# Patient Record
Sex: Male | Born: 1970 | Hispanic: No | Marital: Single | Smoking: Current every day smoker
Health system: Southern US, Community
[De-identification: ages and names within clinical notes are randomized; demographics above are authoritative.]

---

## 1998-11-30 ENCOUNTER — Emergency Department (HOSPITAL_COMMUNITY): Admission: EM | Admit: 1998-11-30 | Discharge: 1998-11-30 | Payer: Self-pay | Admitting: *Deleted

## 2002-10-20 ENCOUNTER — Emergency Department (HOSPITAL_COMMUNITY): Admission: EM | Admit: 2002-10-20 | Discharge: 2002-10-21 | Payer: Self-pay | Admitting: Emergency Medicine

## 2005-12-18 ENCOUNTER — Emergency Department (HOSPITAL_COMMUNITY): Admission: EM | Admit: 2005-12-18 | Discharge: 2005-12-18 | Payer: Self-pay | Admitting: Emergency Medicine

## 2012-03-30 ENCOUNTER — Emergency Department (HOSPITAL_BASED_OUTPATIENT_CLINIC_OR_DEPARTMENT_OTHER)
Admission: EM | Admit: 2012-03-30 | Discharge: 2012-03-30 | Disposition: A | Discharge status: Home / Self Care | Payer: Self-pay | Attending: Emergency Medicine | Admitting: Emergency Medicine

## 2012-03-30 ENCOUNTER — Encounter (HOSPITAL_BASED_OUTPATIENT_CLINIC_OR_DEPARTMENT_OTHER): Payer: Self-pay | Admitting: *Deleted

## 2012-03-30 ENCOUNTER — Emergency Department (HOSPITAL_BASED_OUTPATIENT_CLINIC_OR_DEPARTMENT_OTHER): Payer: Self-pay

## 2012-03-30 DIAGNOSIS — W2203XA Walked into furniture, initial encounter: Secondary | ICD-10-CM | POA: Insufficient documentation

## 2012-03-30 DIAGNOSIS — F17200 Nicotine dependence, unspecified, uncomplicated: Secondary | ICD-10-CM | POA: Insufficient documentation

## 2012-03-30 DIAGNOSIS — S6292XA Unspecified fracture of left wrist and hand, initial encounter for closed fracture: Secondary | ICD-10-CM

## 2012-03-30 DIAGNOSIS — S62309A Unspecified fracture of unspecified metacarpal bone, initial encounter for closed fracture: Secondary | ICD-10-CM | POA: Insufficient documentation

## 2012-03-30 MED ORDER — HYDROCODONE-ACETAMINOPHEN 5-500 MG PO TABS: 1.0000 | ORAL_TABLET | Freq: Four times a day (QID) | ORAL | Status: DC | PRN

## 2012-03-30 NOTE — ED Provider Notes (Signed)
I saw and evaluated the patient, reviewed the resident's note and I agree with the findings and plan.   .Face to face Exam:  General:  Awake HEENT:  Atraumatic Resp:  Normal effort Abd:  Nondistended Neuro:No focal weakness Lymph: No adenopathy   Nelia Shi, MD 03/30/12 1715
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
## 2013-09-30 ENCOUNTER — Emergency Department (HOSPITAL_COMMUNITY)
Admission: EM | Admit: 2013-09-30 | Discharge: 2013-09-30 | Disposition: A | Discharge status: Home / Self Care | Payer: No Typology Code available for payment source | Attending: Emergency Medicine | Admitting: Emergency Medicine

## 2013-09-30 ENCOUNTER — Encounter (HOSPITAL_COMMUNITY): Payer: Self-pay | Admitting: Emergency Medicine

## 2013-09-30 ENCOUNTER — Emergency Department (HOSPITAL_COMMUNITY): Payer: No Typology Code available for payment source

## 2013-09-30 DIAGNOSIS — S29019A Strain of muscle and tendon of unspecified wall of thorax, initial encounter: Secondary | ICD-10-CM

## 2013-09-30 DIAGNOSIS — S0990XA Unspecified injury of head, initial encounter: Secondary | ICD-10-CM | POA: Insufficient documentation

## 2013-09-30 DIAGNOSIS — S239XXA Sprain of unspecified parts of thorax, initial encounter: Secondary | ICD-10-CM | POA: Insufficient documentation

## 2013-09-30 DIAGNOSIS — S161XXA Strain of muscle, fascia and tendon at neck level, initial encounter: Secondary | ICD-10-CM

## 2013-09-30 DIAGNOSIS — Y9389 Activity, other specified: Secondary | ICD-10-CM | POA: Insufficient documentation

## 2013-09-30 DIAGNOSIS — R1011 Right upper quadrant pain: Secondary | ICD-10-CM | POA: Insufficient documentation

## 2013-09-30 DIAGNOSIS — Z880 Allergy status to penicillin: Secondary | ICD-10-CM | POA: Insufficient documentation

## 2013-09-30 DIAGNOSIS — IMO0002 Reserved for concepts with insufficient information to code with codable children: Secondary | ICD-10-CM | POA: Insufficient documentation

## 2013-09-30 DIAGNOSIS — S139XXA Sprain of joints and ligaments of unspecified parts of neck, initial encounter: Secondary | ICD-10-CM | POA: Insufficient documentation

## 2013-09-30 DIAGNOSIS — F17200 Nicotine dependence, unspecified, uncomplicated: Secondary | ICD-10-CM | POA: Insufficient documentation

## 2013-09-30 DIAGNOSIS — Y92410 Unspecified street and highway as the place of occurrence of the external cause: Secondary | ICD-10-CM | POA: Insufficient documentation

## 2013-09-30 LAB — I-STAT CHEM 8, ED
BUN: 13 mg/dL (ref 6–23)
CALCIUM ION: 1.13 mmol/L (ref 1.12–1.23)
CHLORIDE: 101 meq/L (ref 96–112)
Creatinine, Ser: 1.4 mg/dL — ABNORMAL HIGH (ref 0.50–1.35)
GLUCOSE: 91 mg/dL (ref 70–99)
HEMATOCRIT: 44 % (ref 39.0–52.0)
HEMOGLOBIN: 15 g/dL (ref 13.0–17.0)
Potassium: 4 mEq/L (ref 3.7–5.3)
Sodium: 142 mEq/L (ref 137–147)
TCO2: 28 mmol/L (ref 0–100)

## 2013-09-30 MED ORDER — ETODOLAC 500 MG PO TABS: 500.0000 mg | ORAL_TABLET | Freq: Two times a day (BID) | ORAL | Status: AC

## 2013-09-30 MED ORDER — MORPHINE SULFATE 4 MG/ML IJ SOLN
4.0000 mg | Freq: Once | INTRAMUSCULAR | Status: AC
  Administered 2013-09-30: 4 mg via INTRAVENOUS
  Filled 2013-09-30: qty 1

## 2013-09-30 MED ORDER — IOHEXOL 300 MG/ML  SOLN
100.0000 mL | Freq: Once | INTRAMUSCULAR | Status: AC | PRN
  Administered 2013-09-30: 100 mL via INTRAVENOUS

## 2013-09-30 MED ORDER — CYCLOBENZAPRINE HCL 5 MG PO TABS: 5.0000 mg | ORAL_TABLET | Freq: Three times a day (TID) | ORAL | Status: AC | PRN

## 2013-09-30 MED ORDER — TRAMADOL HCL 50 MG PO TABS: 50.0000 mg | ORAL_TABLET | Freq: Four times a day (QID) | ORAL | Status: DC | PRN
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
## 2013-10-04 NOTE — ED Provider Notes (Signed)
I agree with plan for acute assessment.  Dalton BoucheJoshua M Azzie RoupZavitz   Maxson Oddo M Dalton Neuser, MD 10/04/13 [phone]

## 2019-02-14 ENCOUNTER — Emergency Department (HOSPITAL_COMMUNITY): Payer: Self-pay

## 2019-02-14 ENCOUNTER — Other Ambulatory Visit: Payer: Self-pay

## 2019-02-14 ENCOUNTER — Emergency Department (HOSPITAL_COMMUNITY)
Admission: EM | Admit: 2019-02-14 | Discharge: 2019-02-14 | Disposition: A | Discharge status: Home / Self Care | Payer: Self-pay | Attending: Emergency Medicine | Admitting: Emergency Medicine

## 2019-02-14 ENCOUNTER — Encounter (HOSPITAL_COMMUNITY): Payer: Self-pay | Admitting: Emergency Medicine

## 2019-02-14 DIAGNOSIS — W109XXA Fall (on) (from) unspecified stairs and steps, initial encounter: Secondary | ICD-10-CM | POA: Insufficient documentation

## 2019-02-14 DIAGNOSIS — Y929 Unspecified place or not applicable: Secondary | ICD-10-CM | POA: Insufficient documentation

## 2019-02-14 DIAGNOSIS — R0781 Pleurodynia: Secondary | ICD-10-CM

## 2019-02-14 DIAGNOSIS — F17210 Nicotine dependence, cigarettes, uncomplicated: Secondary | ICD-10-CM | POA: Insufficient documentation

## 2019-02-14 DIAGNOSIS — S20212A Contusion of left front wall of thorax, initial encounter: Secondary | ICD-10-CM | POA: Insufficient documentation

## 2019-02-14 DIAGNOSIS — Y999 Unspecified external cause status: Secondary | ICD-10-CM | POA: Insufficient documentation

## 2019-02-14 DIAGNOSIS — Y939 Activity, unspecified: Secondary | ICD-10-CM | POA: Insufficient documentation

## 2019-02-14 MED ORDER — HYDROCODONE-ACETAMINOPHEN 5-325 MG PO TABS: 1.0000 | ORAL_TABLET | Freq: Four times a day (QID) | ORAL | 0 refills | Status: AC | PRN

## 2019-02-14 NOTE — ED Triage Notes (Signed)
Patient slipped and fell last Friday on wet floor , denies LOC/ambulatory , reports left lateral ribcage pain , respirations unlabored.

## 2019-02-14 NOTE — Discharge Instructions (Signed)
Use incentive spirometer as instructed. Reserve vicodin for night time use or when ibuprofen and/or tylenol is not effective.

## 2019-02-14 NOTE — ED Provider Notes (Signed)
[facility]   CSN: [id number] Arrival date & time: 02/14/19  1851     History   Chief Complaint Chief Complaint  Patient presents with  . Rib Injury    HPI Lenard ForthBharat Hoelting is a 48 y.o. male.     Patient states he slipped on a wet step and fell down 4 steps, primarily on the posterior aspect of his left chest. He is reporting pain to the posterior chest wall, with increased pain with movement and deep inspiration.  The history is provided by the patient. No language interpreter was used.  Fall This is a new problem. The current episode started yesterday. The problem occurs constantly. The problem has not changed since onset.Pertinent negatives include no abdominal pain and no shortness of breath.    History reviewed. No pertinent past medical history.  There are no active problems to display for this patient.   History reviewed. No pertinent surgical history.      Home Medications    Prior to Admission medications   Medication Sig Start Date End Date Taking? Authorizing Provider  cyclobenzaprine (FLEXERIL) 5 MG tablet Take 1 tablet (5 mg total) by mouth 3 (three) times daily as needed for muscle spasms. 09/30/13   Linwood DibblesKnapp, Jon, MD  etodolac (LODINE) 500 MG tablet Take 1 tablet (500 mg total) by mouth 2 (two) times daily. 09/30/13   Linwood DibblesKnapp, Jon, MD    Family History Family History  Problem Relation Age of Onset  . Diabetes Father   . Hypertension Father     Social History Social History   Tobacco Use  . Smoking status: Current Every Day Smoker    Types: Cigarettes  Substance Use Topics  . Alcohol use: Yes  . Drug use: Yes    Types: Marijuana     Allergies   Ibuprofen, Penicillins, and Trimox [amoxicillin]   Review of Systems Review of Systems  Respiratory: Negative for shortness of breath.   Gastrointestinal: Negative for abdominal pain.  Musculoskeletal: Negative for back pain.  All other systems  reviewed and are negative.    Physical Exam Updated Vital Signs BP 134/79   Pulse (!) 52   Temp 98.4 F (36.9 C) (Oral)   Resp 16   SpO2 100%   Physical Exam Vitals signs and nursing note reviewed.  Constitutional:      Appearance: Normal appearance.  HENT:     Head: Atraumatic.  Eyes:     Conjunctiva/sclera: Conjunctivae normal.  Neck:     Musculoskeletal: Normal range of motion and neck supple.  Cardiovascular:     Rate and Rhythm: Normal rate and regular rhythm.     Pulses: Normal pulses.  Pulmonary:     Effort: Pulmonary effort is normal.     Breath sounds: Normal breath sounds.  Chest:     Chest wall: Tenderness present. No crepitus.     Comments: Patient with tenderness to left posterior lateral chest wall. No crepitus. Abdominal:     Palpations: Abdomen is soft.  Musculoskeletal: Normal range of motion.  Skin:    General: Skin is warm and dry.  Neurological:     Mental Status: He is alert and oriented to person, place, and time.  Psychiatric:        Mood and Affect: Mood normal.      ED Treatments / Results  Labs (all labs ordered are listed, but only abnormal results are displayed) Labs Reviewed - No data to display  EKG  None  Radiology Dg Ribs Unilateral W/chest Left  Result Date: 02/14/2019 CLINICAL DATA:  Left rib pain after fall last week. EXAM: LEFT RIBS AND CHEST - 3+ VIEW COMPARISON:  Radiographs of September 30, 2013. FINDINGS: No fracture or other bone lesions are seen involving the ribs. There is no evidence of pneumothorax or pleural effusion. Both lungs are clear. Heart size and mediastinal contours are within normal limits. IMPRESSION: Negative. Electronically Signed   By: Marijo Conception M.D.   On: 02/14/2019 19:50    Procedures Procedures (including critical care time)  Medications Ordered in ED Medications - No data to display   Initial Impression / Assessment and Plan / ED Course  I have reviewed the triage vital signs and the  nursing notes.  Pertinent labs & imaging results that were available during my care of the patient were reviewed by me and considered in my medical decision making (see chart for details).        Patient with left posterior chest wall pain s/p fall. No indication of rib fractures on xray. Care instructions provided for chest wall pain and contusion, along with provision of incentive spirometer for use at home. Return precautions discussed. Patient appears safe for discharge.  Final Clinical Impressions(s) / ED Diagnoses   Final diagnoses:  Rib pain on right side  Contusion of rib on left side, initial encounter    ED Discharge Orders         Ordered    HYDROcodone-acetaminophen (NORCO/VICODIN) 5-325 MG tablet  Every 6 hours PRN     02/14/19 2049           Etta Quill, NP 02/15/19 1700    Carmin Muskrat, MD 02/18/19 2219

## 2019-02-14 NOTE — ED Notes (Signed)
Pt has edema on left rib area. Pt states it is painful to inhale, to move his left arm and to lay on left side.

## 2020-10-11 IMAGING — CR LEFT RIBS AND CHEST - 3+ VIEW
5 series · 5 of 5 positions shown · non-contrast
Comparison: Radiographs September 30, 2013.

CLINICAL DATA: Left rib pain after fall last week.

EXAM:
LEFT RIBS AND CHEST - 3+ VIEW

[chest pa]
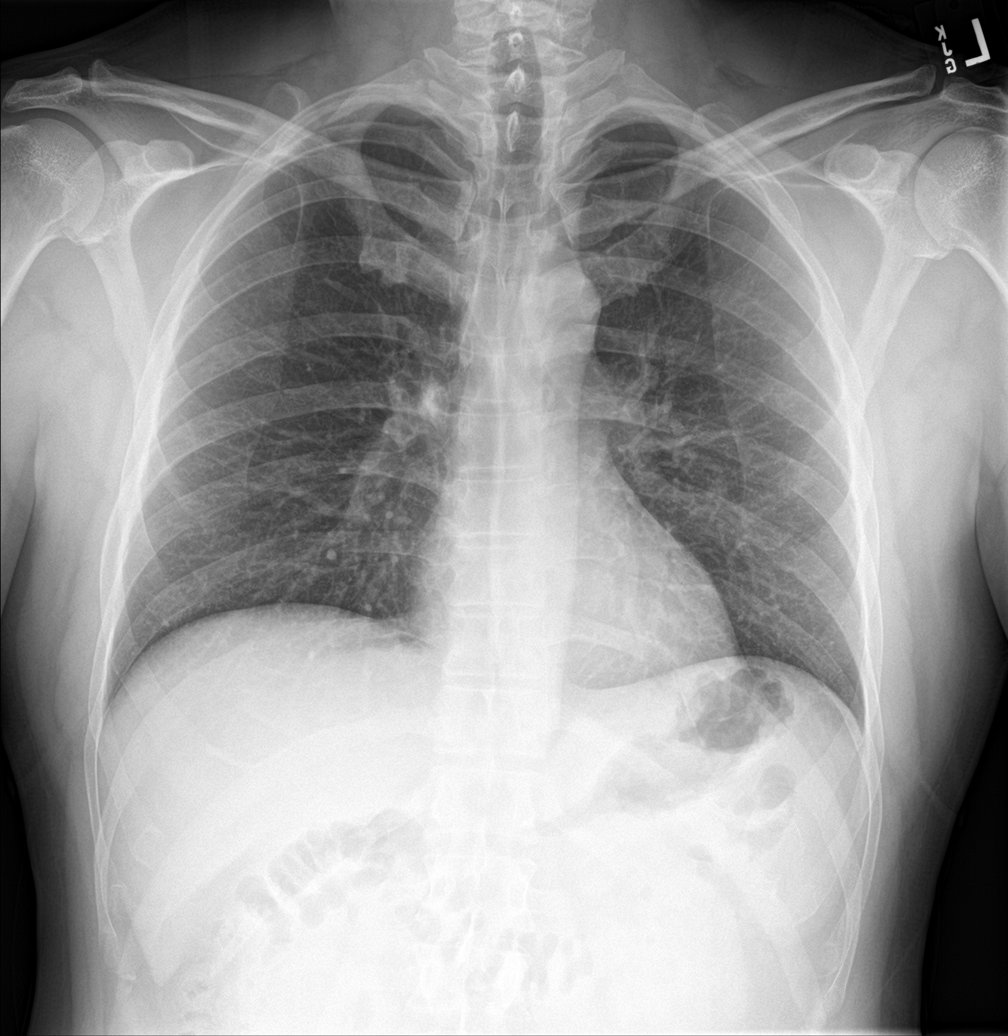

[rib pa obl (1 of 2)]
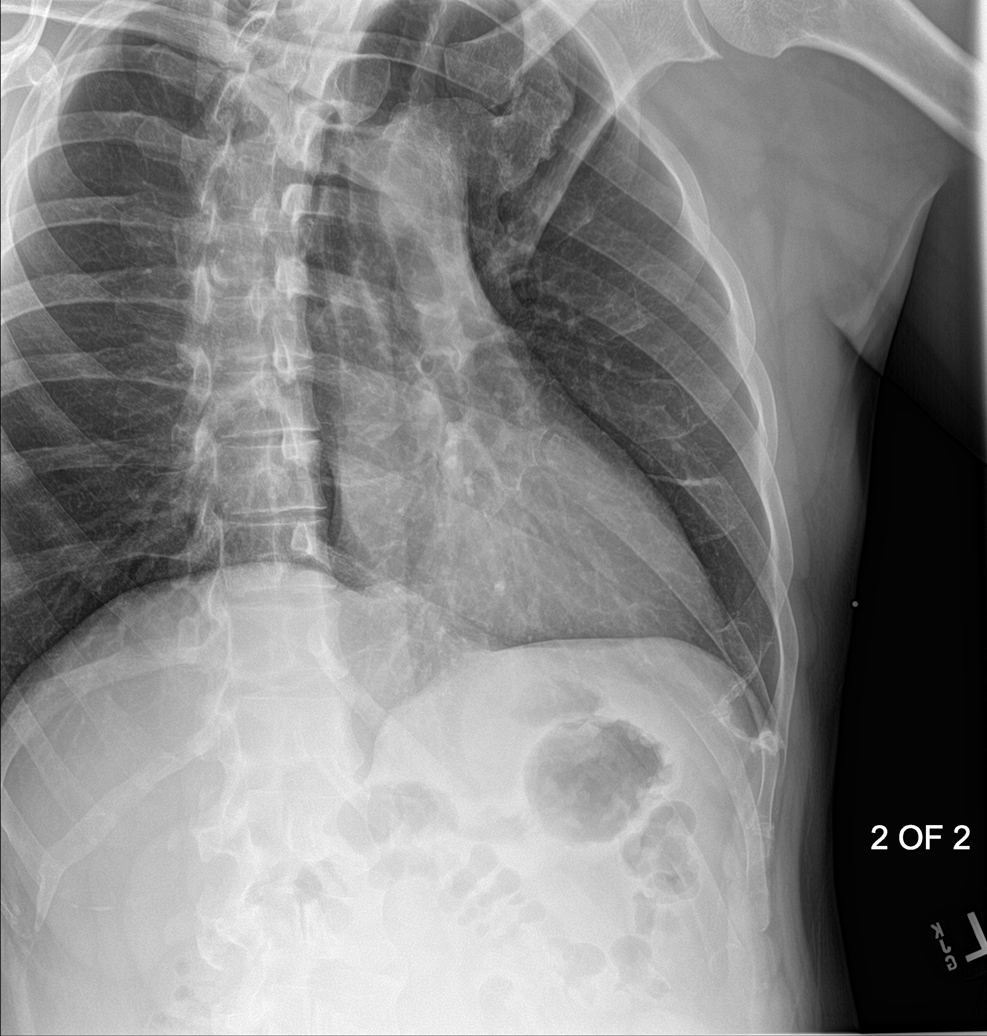

[rib pa (1 of 2)]
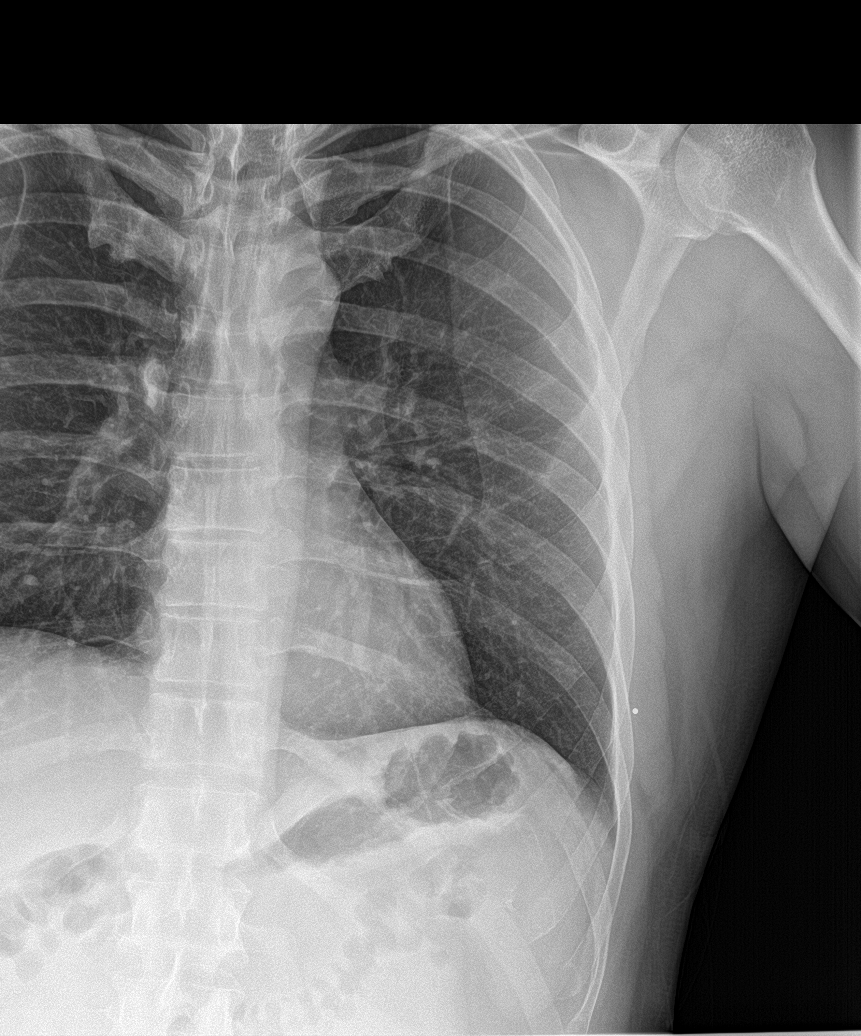

[rib pa (2 of 2)]
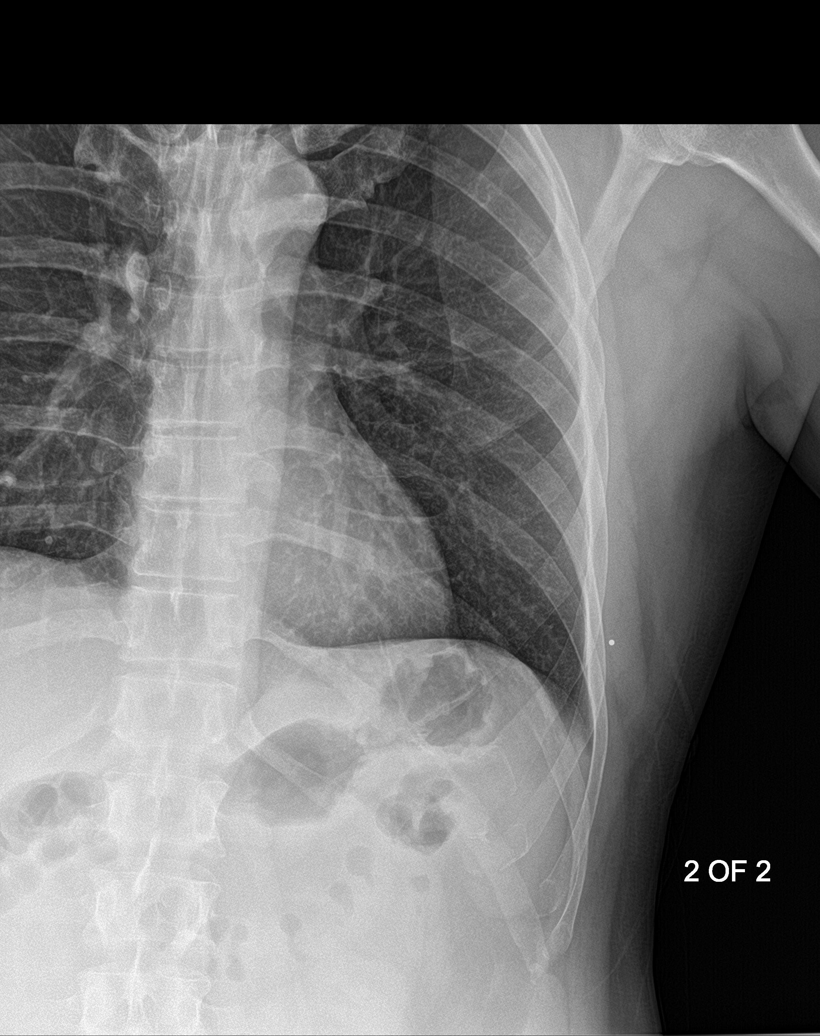

[rib pa obl (2 of 2)]
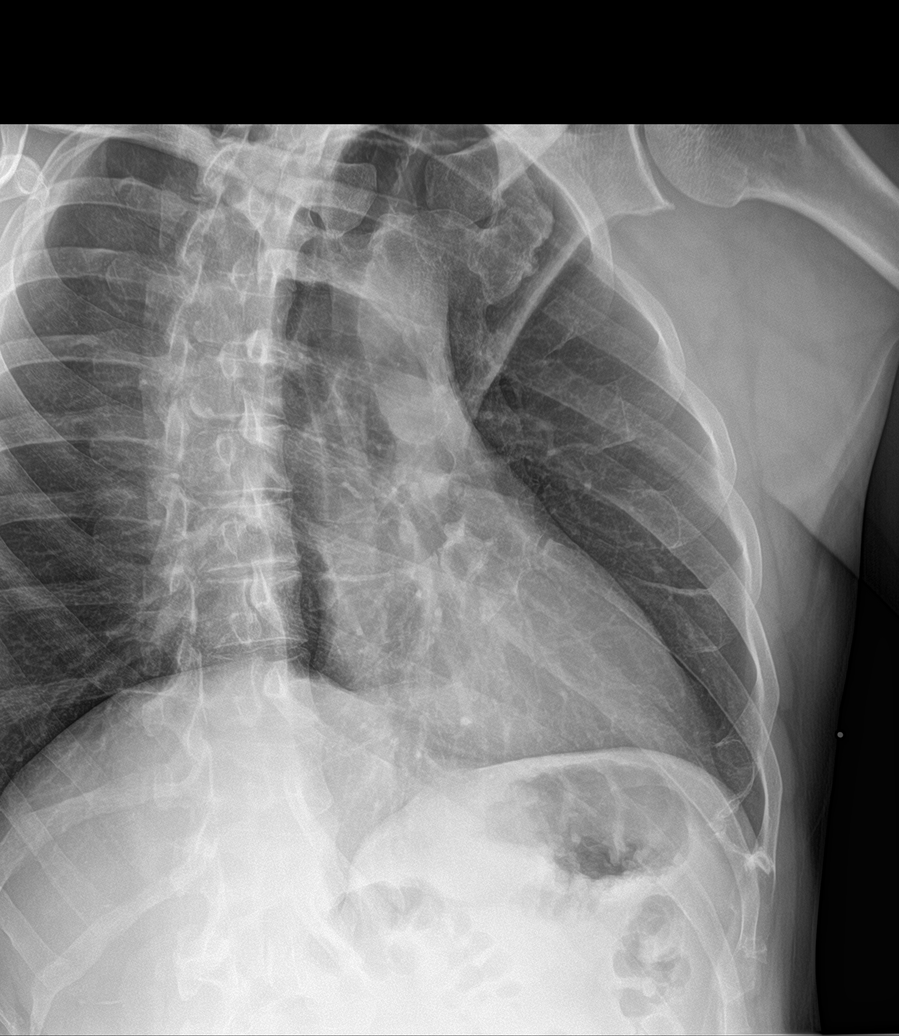

[5 of 5 positions shown; findings below may reference images not displayed]

FINDINGS: No fracture or other bone lesions are seen involving the ribs. There
is no evidence of pneumothorax or pleural effusion. Both lungs are
clear. Heart size and mediastinal contours are within normal limits.
IMPRESSION: Negative.
